# Patient Record
Sex: Male | Born: 1960 | Race: White | Hispanic: No | Marital: Married
Health system: Southern US, Community
[De-identification: ages and names within clinical notes are randomized; demographics above are authoritative.]

---

## 2005-03-14 ENCOUNTER — Emergency Department (HOSPITAL_COMMUNITY): Admission: EM | Admit: 2005-03-14 | Discharge: 2005-03-14 | Payer: Self-pay | Admitting: Emergency Medicine

## 2010-02-11 ENCOUNTER — Encounter: Payer: Self-pay | Admitting: General Surgery

## 2014-10-02 ENCOUNTER — Emergency Department (HOSPITAL_COMMUNITY)
Admission: EM | Admit: 2014-10-02 | Discharge: 2014-10-02 | Disposition: A | Discharge status: Home / Self Care | Payer: Managed Care, Other (non HMO) | Attending: Emergency Medicine | Admitting: Emergency Medicine

## 2014-10-02 ENCOUNTER — Encounter (HOSPITAL_COMMUNITY): Payer: Self-pay | Admitting: Emergency Medicine

## 2014-10-02 ENCOUNTER — Emergency Department (HOSPITAL_COMMUNITY): Payer: Managed Care, Other (non HMO)

## 2014-10-02 DIAGNOSIS — S8392XA Sprain of unspecified site of left knee, initial encounter: Secondary | ICD-10-CM

## 2014-10-02 DIAGNOSIS — Y92410 Unspecified street and highway as the place of occurrence of the external cause: Secondary | ICD-10-CM | POA: Insufficient documentation

## 2014-10-02 DIAGNOSIS — Y9389 Activity, other specified: Secondary | ICD-10-CM | POA: Insufficient documentation

## 2014-10-02 DIAGNOSIS — S8992XA Unspecified injury of left lower leg, initial encounter: Secondary | ICD-10-CM | POA: Diagnosis present

## 2014-10-02 DIAGNOSIS — Y998 Other external cause status: Secondary | ICD-10-CM | POA: Diagnosis not present

## 2014-10-02 MED ORDER — HYDROCODONE-ACETAMINOPHEN 5-325 MG PO TABS: 1.0000 | ORAL_TABLET | Freq: Four times a day (QID) | ORAL | Status: AC | PRN

## 2014-10-02 MED ORDER — IBUPROFEN 600 MG PO TABS: 600.0000 mg | ORAL_TABLET | Freq: Four times a day (QID) | ORAL | Status: AC | PRN
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
## 2014-10-02 NOTE — Discharge Instructions (Signed)
Keep immobilizer on when walking around. Crutches for ambulation. Keep your knee elevated, ice several times a day. Motrin for pain and inflammation. Take Norco for severe pain only as needed. Call grades per orthopedics tomorrow and follow-up with them in next several days for recheck and further evaluation.  Knee Sprain A knee sprain is a tear in one of the strong, fibrous tissues that connect the bones (ligaments) in your knee. The severity of the sprain depends on how much of the ligament is torn. The tear can be either partial or complete. CAUSES  Often, sprains are a result of a fall or injury. The force of the impact causes the fibers of your ligament to stretch too much. This excess tension causes the fibers of your ligament to tear. SIGNS AND SYMPTOMS  You may have some loss of motion in your knee. Other symptoms include:  Bruising.  Pain in the knee area.  Tenderness of the knee to the touch.  Swelling. DIAGNOSIS  To diagnose a knee sprain, your health care provider will physically examine your knee. Your health care provider may also suggest an X-ray exam of your knee to make sure no bones are broken. TREATMENT  If your ligament is only partially torn, treatment usually involves keeping the knee in a fixed position (immobilization) or bracing your knee for activities that require movement for several weeks. To do this, your health care provider will apply a bandage, cast, or splint to keep your knee from moving and to support your knee during movement until it heals. For a partially torn ligament, the healing process usually takes 4-6 weeks. If your ligament is completely torn, depending on which ligament it is, you may need surgery to reconnect the ligament to the bone or reconstruct it. After surgery, a cast or splint may be applied and will need to stay on your knee for 4-6 weeks while your ligament heals. HOME CARE INSTRUCTIONS  Keep your injured knee elevated to decrease  swelling.  To ease pain and swelling, apply ice to the injured area:  Put ice in a plastic bag.  Place a towel between your skin and the bag.  Leave the ice on for 20 minutes, 2-3 times a day.  Only take medicine for pain as directed by your health care provider.  Do not leave your knee unprotected until pain and stiffness go away (usually 4-6 weeks).  If you have a cast or splint, do not allow it to get wet. If you have been instructed not to remove it, cover it with a plastic bag when you shower or bathe. Do not swim.  Your health care provider may suggest exercises for you to do during your recovery to prevent or limit permanent weakness and stiffness. SEEK IMMEDIATE MEDICAL CARE IF:  Your cast or splint becomes damaged.  Your pain becomes worse.  You have significant pain, swelling, or numbness below the cast or splint. MAKE SURE YOU:  Understand these instructions.  Will watch your condition.  Will get help right away if you are not doing well or get worse. Document Released: 01/07/2005 Document Revised: 10/28/2012 Document Reviewed: 08/19/2012 [facility] Patient Information 2015 [place], [place]. This information is not intended to replace advice given to you by your health care provider. Make sure you discuss any questions you have with your health care provider.
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
## 2016-03-05 DIAGNOSIS — Z0000 Encounter for general adult medical examination without abnormal findings: Secondary | ICD-10-CM | POA: Diagnosis not present

## 2016-03-05 DIAGNOSIS — R350 Frequency of micturition: Secondary | ICD-10-CM | POA: Diagnosis not present
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
## 2017-03-06 DIAGNOSIS — Z125 Encounter for screening for malignant neoplasm of prostate: Secondary | ICD-10-CM | POA: Diagnosis not present

## 2017-03-06 DIAGNOSIS — R350 Frequency of micturition: Secondary | ICD-10-CM | POA: Diagnosis not present

## 2017-03-06 DIAGNOSIS — Z0000 Encounter for general adult medical examination without abnormal findings: Secondary | ICD-10-CM | POA: Diagnosis not present

## 2017-03-06 DIAGNOSIS — Z13220 Encounter for screening for lipoid disorders: Secondary | ICD-10-CM | POA: Diagnosis not present

## 2017-03-07 DIAGNOSIS — H6123 Impacted cerumen, bilateral: Secondary | ICD-10-CM | POA: Diagnosis not present

## 2017-04-03 DIAGNOSIS — N401 Enlarged prostate with lower urinary tract symptoms: Secondary | ICD-10-CM | POA: Diagnosis not present

## 2017-04-03 DIAGNOSIS — R350 Frequency of micturition: Secondary | ICD-10-CM | POA: Diagnosis not present

## 2017-04-03 DIAGNOSIS — R3912 Poor urinary stream: Secondary | ICD-10-CM | POA: Diagnosis not present

## 2017-04-03 DIAGNOSIS — R351 Nocturia: Secondary | ICD-10-CM | POA: Diagnosis not present

## 2017-05-23 DIAGNOSIS — N401 Enlarged prostate with lower urinary tract symptoms: Secondary | ICD-10-CM | POA: Diagnosis not present

## 2017-05-23 DIAGNOSIS — R3912 Poor urinary stream: Secondary | ICD-10-CM | POA: Diagnosis not present

## 2017-05-23 DIAGNOSIS — R351 Nocturia: Secondary | ICD-10-CM | POA: Diagnosis not present

## 2017-08-27 DIAGNOSIS — H6122 Impacted cerumen, left ear: Secondary | ICD-10-CM | POA: Diagnosis not present

## 2017-08-27 DIAGNOSIS — H9202 Otalgia, left ear: Secondary | ICD-10-CM | POA: Diagnosis not present

## 2017-12-17 DIAGNOSIS — L600 Ingrowing nail: Secondary | ICD-10-CM | POA: Diagnosis not present

## 2018-04-09 DIAGNOSIS — H6123 Impacted cerumen, bilateral: Secondary | ICD-10-CM | POA: Diagnosis not present

## 2018-04-09 DIAGNOSIS — R42 Dizziness and giddiness: Secondary | ICD-10-CM | POA: Diagnosis not present

## 2018-04-09 DIAGNOSIS — H9041 Sensorineural hearing loss, unilateral, right ear, with unrestricted hearing on the contralateral side: Secondary | ICD-10-CM | POA: Diagnosis not present

## 2018-04-17 DIAGNOSIS — R42 Dizziness and giddiness: Secondary | ICD-10-CM | POA: Diagnosis not present

## 2018-07-09 DIAGNOSIS — N401 Enlarged prostate with lower urinary tract symptoms: Secondary | ICD-10-CM | POA: Diagnosis not present

## 2018-07-09 DIAGNOSIS — R351 Nocturia: Secondary | ICD-10-CM | POA: Diagnosis not present

## 2018-07-09 DIAGNOSIS — R350 Frequency of micturition: Secondary | ICD-10-CM | POA: Diagnosis not present

## 2018-07-09 DIAGNOSIS — R3912 Poor urinary stream: Secondary | ICD-10-CM | POA: Diagnosis not present

## 2018-07-28 DIAGNOSIS — N401 Enlarged prostate with lower urinary tract symptoms: Secondary | ICD-10-CM | POA: Diagnosis not present

## 2018-07-28 DIAGNOSIS — Z0000 Encounter for general adult medical examination without abnormal findings: Secondary | ICD-10-CM | POA: Diagnosis not present

## 2018-07-28 DIAGNOSIS — H8101 Meniere's disease, right ear: Secondary | ICD-10-CM | POA: Diagnosis not present

## 2018-07-28 DIAGNOSIS — Z13220 Encounter for screening for lipoid disorders: Secondary | ICD-10-CM | POA: Diagnosis not present

## 2018-11-16 DIAGNOSIS — Z23 Encounter for immunization: Secondary | ICD-10-CM | POA: Diagnosis not present

## 2018-11-30 DIAGNOSIS — M25531 Pain in right wrist: Secondary | ICD-10-CM | POA: Diagnosis not present

## 2019-04-03 ENCOUNTER — Ambulatory Visit: Payer: Managed Care, Other (non HMO) | Attending: Internal Medicine

## 2019-04-03 DIAGNOSIS — Z23 Encounter for immunization: Secondary | ICD-10-CM

## 2019-04-03 NOTE — Progress Notes (Signed)
   Covid-19 Vaccination Clinic  Name:  Corey Harper    MRN: [id number] DOB: [birth year]  04/03/2019  [name] was observed post Covid-19 immunization for 15 minutes without incident. He was provided with Vaccine Information Sheet and instruction to access the V-Safe system.   [name] was instructed to call 911 with any severe reactions post vaccine: Marland Kitchen Difficulty breathing  . Swelling of face and throat  . A fast heartbeat  . A bad rash all over body  . Dizziness and weakness   Immunizations Administered    Name Date Dose VIS Date Route   Pfizer COVID-19 Vaccine 04/03/2019  1:02 PM 0.3 mL 01/01/2019 Intramuscular   Manufacturer: [employer], [employer]   Lot: [id number]   NDC: [address]-1

## 2019-04-27 ENCOUNTER — Ambulatory Visit: Payer: Managed Care, Other (non HMO) | Attending: Internal Medicine

## 2019-04-27 DIAGNOSIS — Z23 Encounter for immunization: Secondary | ICD-10-CM

## 2019-04-27 NOTE — Progress Notes (Signed)
   Covid-19 Vaccination Clinic  Name:  Corey Harper    MRN: [id number] DOB: 13-Oct-1960  04/27/2019  Mr. Corey Harper was observed post Covid-19 immunization for 15 minutes without incident. He was provided with Vaccine Information Sheet and instruction to access the V-Safe system.   [name] was instructed to call 911 with any severe reactions post vaccine: Marland Kitchen Difficulty breathing  . Swelling of face and throat  . A fast heartbeat  . A bad rash all over body  . Dizziness and weakness   Immunizations Administered    Name Date Dose VIS Date Route   Pfizer COVID-19 Vaccine 04/27/2019  1:55 PM 0.3 mL 01/01/2019 Intramuscular   Manufacturer: [employer], [employer]   Lot: [id number]   NDC: [address]-7

## 2019-07-01 DIAGNOSIS — R3912 Poor urinary stream: Secondary | ICD-10-CM | POA: Diagnosis not present

## 2019-07-01 DIAGNOSIS — N401 Enlarged prostate with lower urinary tract symptoms: Secondary | ICD-10-CM | POA: Diagnosis not present

## 2019-07-08 DIAGNOSIS — R3912 Poor urinary stream: Secondary | ICD-10-CM | POA: Diagnosis not present

## 2019-07-08 DIAGNOSIS — N401 Enlarged prostate with lower urinary tract symptoms: Secondary | ICD-10-CM | POA: Diagnosis not present

## 2019-07-08 DIAGNOSIS — R350 Frequency of micturition: Secondary | ICD-10-CM | POA: Diagnosis not present

## 2019-07-08 DIAGNOSIS — R351 Nocturia: Secondary | ICD-10-CM | POA: Diagnosis not present

## 2019-08-02 DIAGNOSIS — Z131 Encounter for screening for diabetes mellitus: Secondary | ICD-10-CM | POA: Diagnosis not present

## 2019-08-02 DIAGNOSIS — Z0000 Encounter for general adult medical examination without abnormal findings: Secondary | ICD-10-CM | POA: Diagnosis not present

## 2019-08-02 DIAGNOSIS — Z13220 Encounter for screening for lipoid disorders: Secondary | ICD-10-CM | POA: Diagnosis not present

## 2019-08-02 DIAGNOSIS — Z125 Encounter for screening for malignant neoplasm of prostate: Secondary | ICD-10-CM | POA: Diagnosis not present

## 2019-08-02 DIAGNOSIS — H6122 Impacted cerumen, left ear: Secondary | ICD-10-CM | POA: Diagnosis not present

## 2019-10-29 DIAGNOSIS — Z23 Encounter for immunization: Secondary | ICD-10-CM | POA: Diagnosis not present

## 2019-11-19 DIAGNOSIS — H8109 Meniere's disease, unspecified ear: Secondary | ICD-10-CM | POA: Diagnosis not present

## 2019-11-19 DIAGNOSIS — H9041 Sensorineural hearing loss, unilateral, right ear, with unrestricted hearing on the contralateral side: Secondary | ICD-10-CM | POA: Diagnosis not present

## 2019-11-19 DIAGNOSIS — H6122 Impacted cerumen, left ear: Secondary | ICD-10-CM | POA: Diagnosis not present

## 2020-01-31 DIAGNOSIS — Z1159 Encounter for screening for other viral diseases: Secondary | ICD-10-CM | POA: Diagnosis not present

## 2020-06-07 DIAGNOSIS — H9041 Sensorineural hearing loss, unilateral, right ear, with unrestricted hearing on the contralateral side: Secondary | ICD-10-CM | POA: Diagnosis not present

## 2020-06-07 DIAGNOSIS — H8110 Benign paroxysmal vertigo, unspecified ear: Secondary | ICD-10-CM | POA: Diagnosis not present

## 2020-06-07 DIAGNOSIS — H6123 Impacted cerumen, bilateral: Secondary | ICD-10-CM | POA: Diagnosis not present

## 2020-06-07 DIAGNOSIS — H8109 Meniere's disease, unspecified ear: Secondary | ICD-10-CM | POA: Diagnosis not present

## 2020-06-29 DIAGNOSIS — R350 Frequency of micturition: Secondary | ICD-10-CM | POA: Diagnosis not present

## 2020-06-29 DIAGNOSIS — N401 Enlarged prostate with lower urinary tract symptoms: Secondary | ICD-10-CM | POA: Diagnosis not present

## 2020-07-06 DIAGNOSIS — N401 Enlarged prostate with lower urinary tract symptoms: Secondary | ICD-10-CM | POA: Diagnosis not present

## 2020-07-06 DIAGNOSIS — R351 Nocturia: Secondary | ICD-10-CM | POA: Diagnosis not present

## 2020-07-06 DIAGNOSIS — R3912 Poor urinary stream: Secondary | ICD-10-CM | POA: Diagnosis not present

## 2020-08-21 DIAGNOSIS — Z0000 Encounter for general adult medical examination without abnormal findings: Secondary | ICD-10-CM | POA: Diagnosis not present

## 2020-08-21 DIAGNOSIS — Z131 Encounter for screening for diabetes mellitus: Secondary | ICD-10-CM | POA: Diagnosis not present

## 2020-08-21 DIAGNOSIS — Z136 Encounter for screening for cardiovascular disorders: Secondary | ICD-10-CM | POA: Diagnosis not present

## 2020-08-21 DIAGNOSIS — Z23 Encounter for immunization: Secondary | ICD-10-CM | POA: Diagnosis not present

## 2020-08-21 DIAGNOSIS — Z13220 Encounter for screening for lipoid disorders: Secondary | ICD-10-CM | POA: Diagnosis not present

## 2020-11-01 DIAGNOSIS — Z23 Encounter for immunization: Secondary | ICD-10-CM | POA: Diagnosis not present

## 2021-06-06 DIAGNOSIS — H6120 Impacted cerumen, unspecified ear: Secondary | ICD-10-CM | POA: Diagnosis not present

## 2021-06-06 DIAGNOSIS — H8109 Meniere's disease, unspecified ear: Secondary | ICD-10-CM | POA: Diagnosis not present

## 2021-07-09 DIAGNOSIS — H9041 Sensorineural hearing loss, unilateral, right ear, with unrestricted hearing on the contralateral side: Secondary | ICD-10-CM | POA: Diagnosis not present

## 2021-07-12 DIAGNOSIS — N401 Enlarged prostate with lower urinary tract symptoms: Secondary | ICD-10-CM | POA: Diagnosis not present

## 2021-07-12 DIAGNOSIS — R350 Frequency of micturition: Secondary | ICD-10-CM | POA: Diagnosis not present

## 2021-07-19 DIAGNOSIS — R3912 Poor urinary stream: Secondary | ICD-10-CM | POA: Diagnosis not present

## 2021-07-19 DIAGNOSIS — N401 Enlarged prostate with lower urinary tract symptoms: Secondary | ICD-10-CM | POA: Diagnosis not present

## 2021-07-19 DIAGNOSIS — R351 Nocturia: Secondary | ICD-10-CM | POA: Diagnosis not present

## 2021-07-19 DIAGNOSIS — R350 Frequency of micturition: Secondary | ICD-10-CM | POA: Diagnosis not present

## 2021-10-04 DIAGNOSIS — Z0000 Encounter for general adult medical examination without abnormal findings: Secondary | ICD-10-CM | POA: Diagnosis not present

## 2021-10-04 DIAGNOSIS — Z23 Encounter for immunization: Secondary | ICD-10-CM | POA: Diagnosis not present

## 2021-11-17 DIAGNOSIS — L03114 Cellulitis of left upper limb: Secondary | ICD-10-CM | POA: Diagnosis not present

## 2021-12-28 DIAGNOSIS — Z23 Encounter for immunization: Secondary | ICD-10-CM | POA: Diagnosis not present

## 2022-01-17 DIAGNOSIS — R3912 Poor urinary stream: Secondary | ICD-10-CM | POA: Diagnosis not present

## 2022-01-17 DIAGNOSIS — R351 Nocturia: Secondary | ICD-10-CM | POA: Diagnosis not present

## 2022-01-17 DIAGNOSIS — R350 Frequency of micturition: Secondary | ICD-10-CM | POA: Diagnosis not present

## 2022-01-17 DIAGNOSIS — N401 Enlarged prostate with lower urinary tract symptoms: Secondary | ICD-10-CM | POA: Diagnosis not present

## 2022-07-31 DIAGNOSIS — H8109 Meniere's disease, unspecified ear: Secondary | ICD-10-CM | POA: Diagnosis not present

## 2022-07-31 DIAGNOSIS — H9041 Sensorineural hearing loss, unilateral, right ear, with unrestricted hearing on the contralateral side: Secondary | ICD-10-CM | POA: Diagnosis not present

## 2022-07-31 DIAGNOSIS — H6123 Impacted cerumen, bilateral: Secondary | ICD-10-CM | POA: Diagnosis not present

## 2022-10-17 DIAGNOSIS — Z13220 Encounter for screening for lipoid disorders: Secondary | ICD-10-CM | POA: Diagnosis not present

## 2022-10-17 DIAGNOSIS — Z0000 Encounter for general adult medical examination without abnormal findings: Secondary | ICD-10-CM | POA: Diagnosis not present

## 2023-01-09 DIAGNOSIS — R350 Frequency of micturition: Secondary | ICD-10-CM | POA: Diagnosis not present

## 2023-01-09 DIAGNOSIS — N401 Enlarged prostate with lower urinary tract symptoms: Secondary | ICD-10-CM | POA: Diagnosis not present

## 2023-01-16 DIAGNOSIS — N401 Enlarged prostate with lower urinary tract symptoms: Secondary | ICD-10-CM | POA: Diagnosis not present

## 2023-01-16 DIAGNOSIS — R350 Frequency of micturition: Secondary | ICD-10-CM | POA: Diagnosis not present

## 2023-02-13 DIAGNOSIS — Z1211 Encounter for screening for malignant neoplasm of colon: Secondary | ICD-10-CM | POA: Diagnosis not present

## 2023-02-13 DIAGNOSIS — R112 Nausea with vomiting, unspecified: Secondary | ICD-10-CM | POA: Diagnosis not present

## 2023-04-02 DIAGNOSIS — Z1211 Encounter for screening for malignant neoplasm of colon: Secondary | ICD-10-CM | POA: Diagnosis not present

## 2023-04-02 DIAGNOSIS — K648 Other hemorrhoids: Secondary | ICD-10-CM | POA: Diagnosis not present

## 2023-04-02 DIAGNOSIS — K5730 Diverticulosis of large intestine without perforation or abscess without bleeding: Secondary | ICD-10-CM | POA: Diagnosis not present

## 2023-04-30 DIAGNOSIS — H6123 Impacted cerumen, bilateral: Secondary | ICD-10-CM | POA: Diagnosis not present

## 2023-04-30 DIAGNOSIS — H9041 Sensorineural hearing loss, unilateral, right ear, with unrestricted hearing on the contralateral side: Secondary | ICD-10-CM | POA: Diagnosis not present

## 2023-04-30 DIAGNOSIS — H9313 Tinnitus, bilateral: Secondary | ICD-10-CM | POA: Diagnosis not present

## 2023-06-25 DIAGNOSIS — H9041 Sensorineural hearing loss, unilateral, right ear, with unrestricted hearing on the contralateral side: Secondary | ICD-10-CM | POA: Diagnosis not present

## 2023-07-07 DIAGNOSIS — H9041 Sensorineural hearing loss, unilateral, right ear, with unrestricted hearing on the contralateral side: Secondary | ICD-10-CM | POA: Diagnosis not present

## 2024-01-09 DIAGNOSIS — Z125 Encounter for screening for malignant neoplasm of prostate: Secondary | ICD-10-CM | POA: Diagnosis not present

## 2024-01-19 DIAGNOSIS — R3915 Urgency of urination: Secondary | ICD-10-CM | POA: Diagnosis not present

## 2024-01-19 DIAGNOSIS — N5201 Erectile dysfunction due to arterial insufficiency: Secondary | ICD-10-CM | POA: Diagnosis not present

## 2024-01-19 DIAGNOSIS — N401 Enlarged prostate with lower urinary tract symptoms: Secondary | ICD-10-CM | POA: Diagnosis not present

## 2024-01-19 DIAGNOSIS — R350 Frequency of micturition: Secondary | ICD-10-CM | POA: Diagnosis not present
